# Patient Record
Sex: Male | Born: 1990 | Race: White | Hispanic: No | Marital: Single
Health system: Southern US, Community
[De-identification: ages and names within clinical notes are randomized; demographics above are authoritative.]

---
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
## 2008-12-29 IMAGING — US US SCROTUM
1 series · 14 of 25 positions shown · non-contrast
Comparison: None

CLINICAL DATA: RIGHT TESTICULAR MASS;

ULTRASOUND OF SCROTUM
TECHNIQUE: Complete ultrasound examination of the testicles,
epididymis, and other scrotal structures was performed.

[Series 1: us scrotum · 0.08mm/px · 14 of 50 slices shown]
[im 1/50]
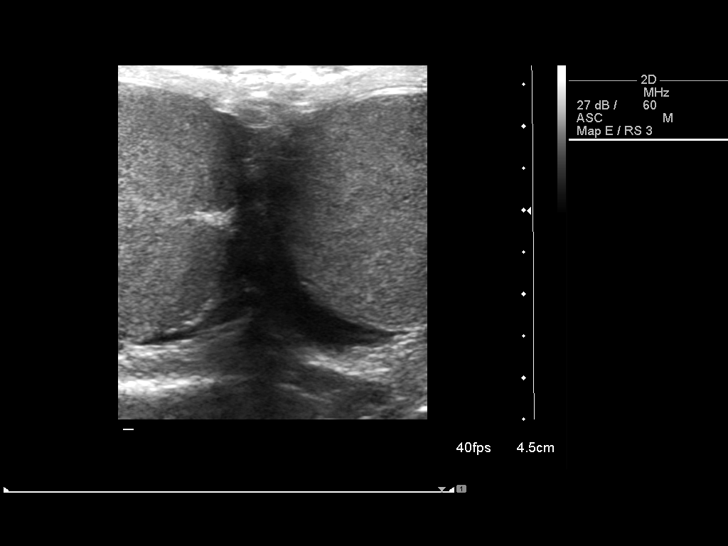
[im 5/50]
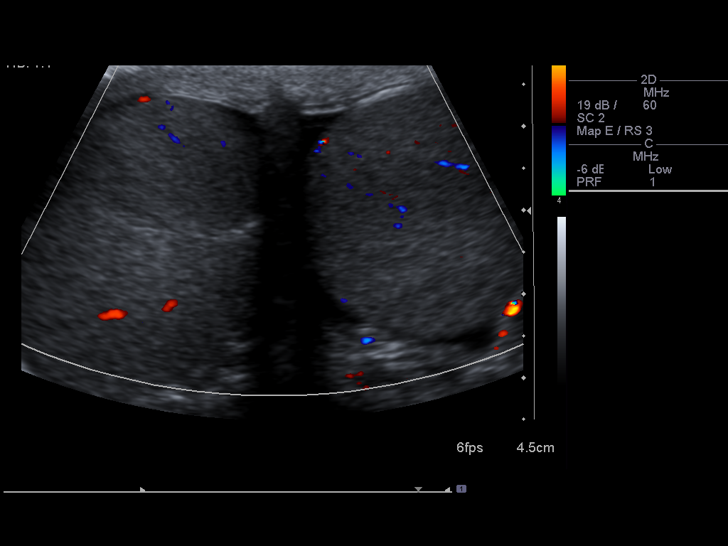
[im 9/50]
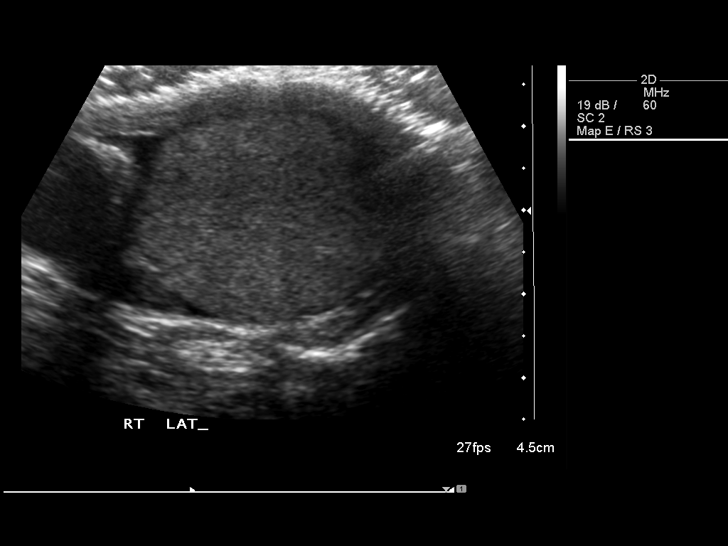
[im 13/50]
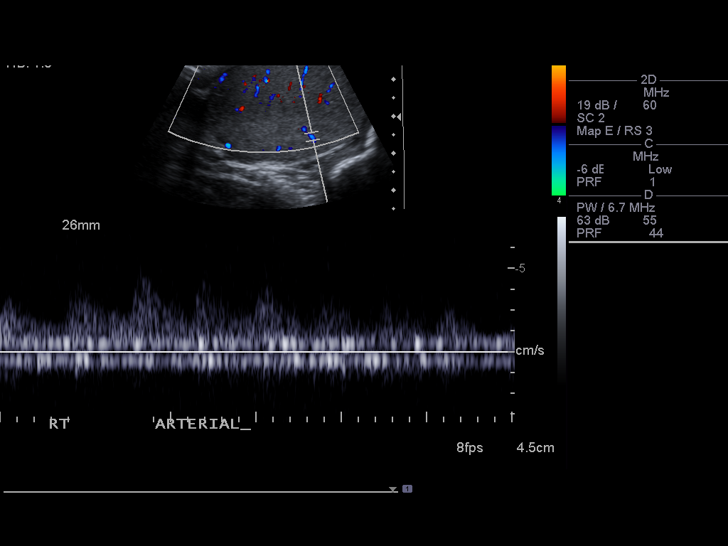
[im 17/50]
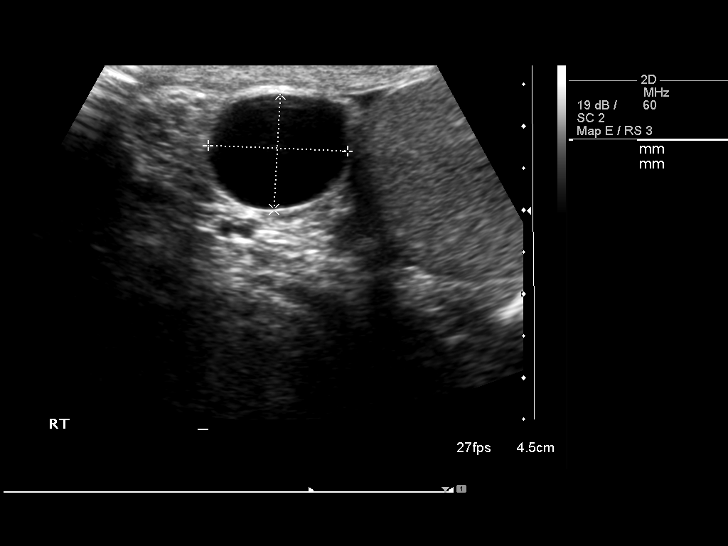
[im 19/50]
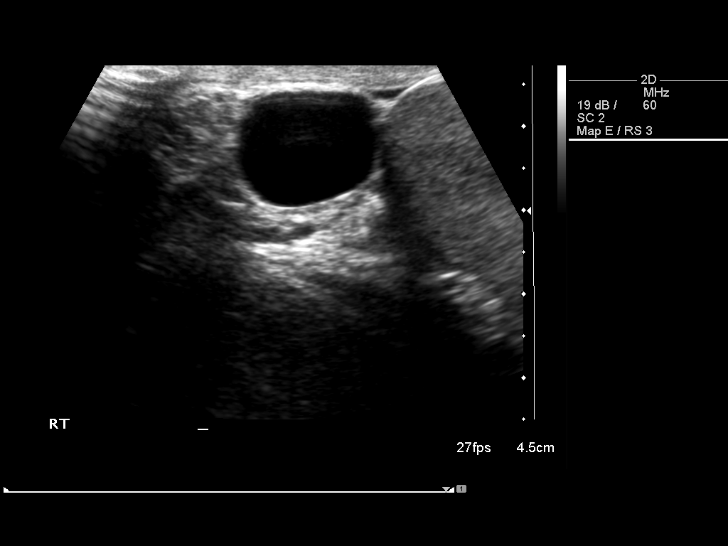
[im 23/50]
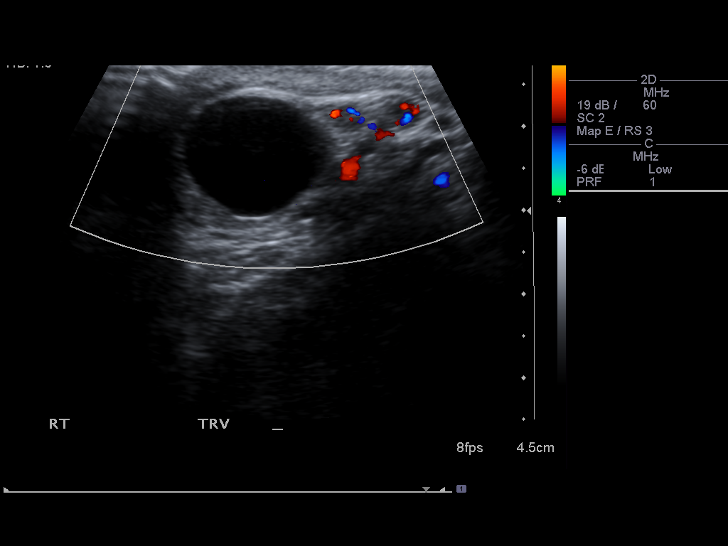
[im 27/50]
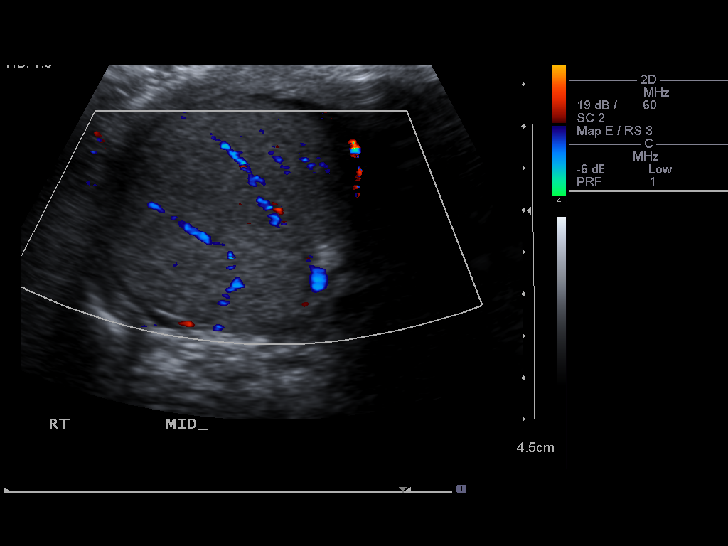
[im 31/50]
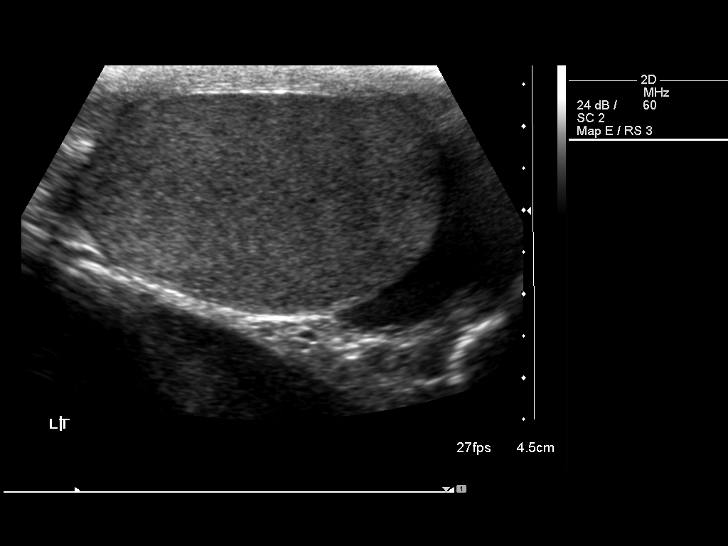
[im 33/50]
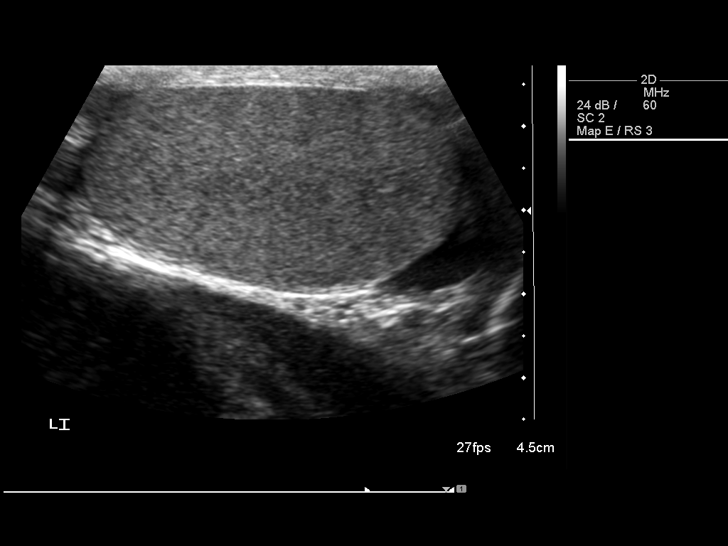
[im 37/50]
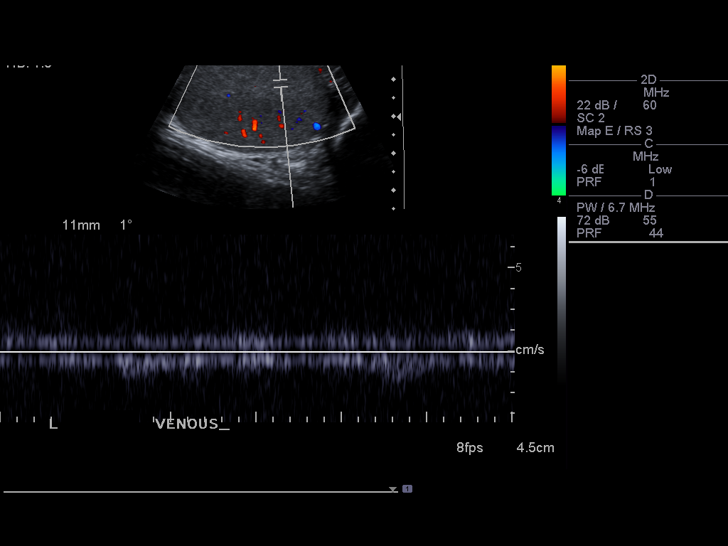
[im 41/50]
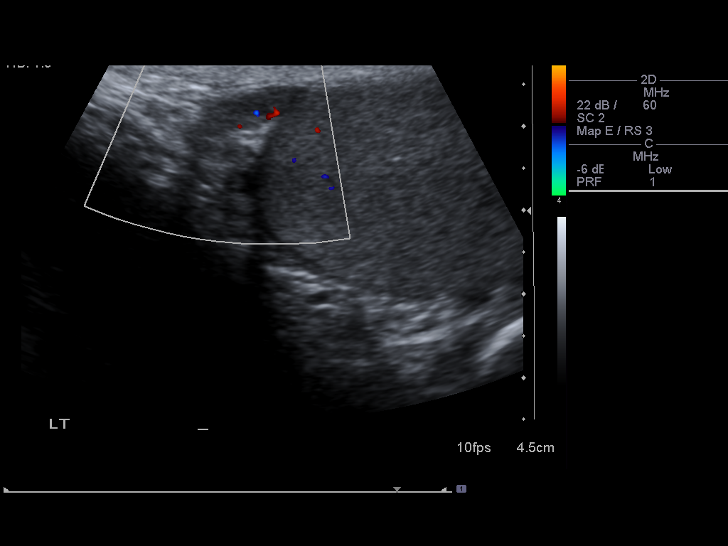
[im 45/50]
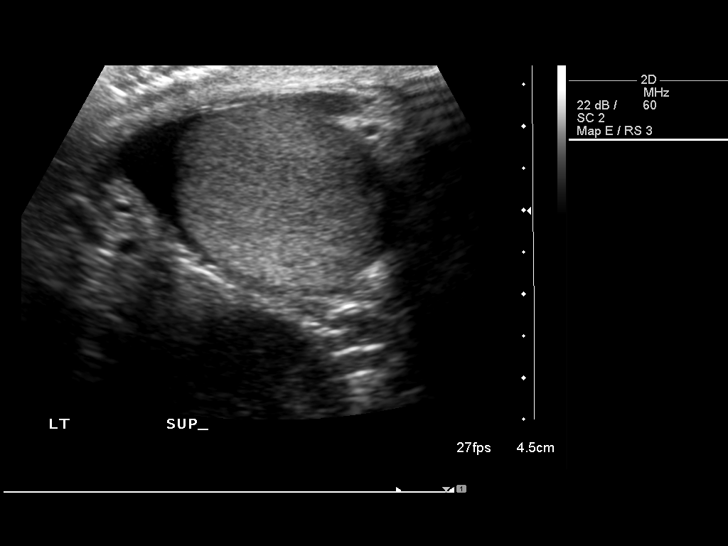
[im 50/50]
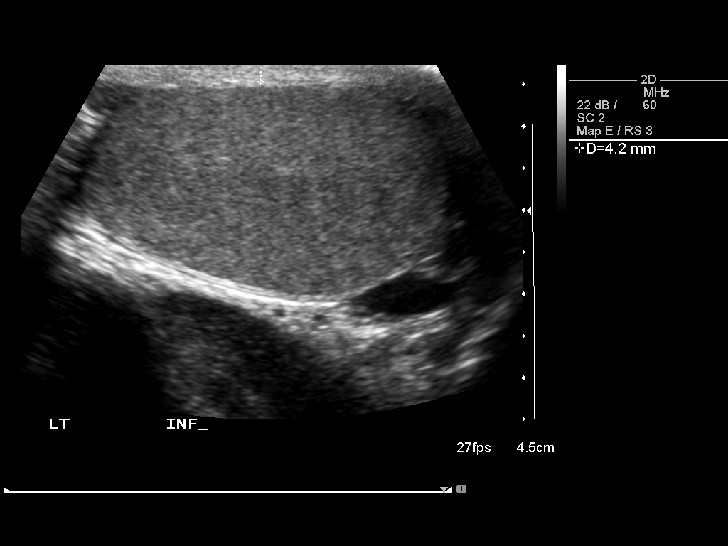

[14 of 25 positions shown; findings below may reference images not displayed]

FINDINGS: The right testicle measures 4.2 x 2.8 x 3.5 cm.  The left
testicle measures 4.4 x 2.6 x 3.1 cm.  Normal testicular
echotexture / echogenicity.  Normal color Doppler flow.  Arterial
and venous waveforms are noted.  There is a cystic lesion in the
right epididymis measuring 1.7 x 1.4 x 1.6 cm.  This is compatible
with an epididymal cyst or spermatocele.  Left epididymis
unremarkable.  Small amount of peritesticular fluid bilaterally.
IMPRESSION: Right epididymal cyst versus spermatocele.  See comments above.

## 2010-05-30 NOTE — Op Note (Signed)
NAMEBEACHER, EVERY               ACCOUNT NO.:  [id number]   MEDICAL RECORD NO.:  [id number]          PATIENT TYPE:  AMB   LOCATION:  DAY                           FACILITY:  [facility]   PHYSICIAN:  Barbaraann Barthel, M.D. DATE OF BIRTH:  [birth year]   DATE OF PROCEDURE:  03/05/2007  DATE OF DISCHARGE:                               OPERATIVE REPORT   DIAGNOSIS:  Left inguinal hernia.   PROCEDURE:  Left inguinal herniorrhaphy (modified McVay repair, no mesh  placed).   SPECIMEN:  Left inguinal hernia sac.   NOTE:  This is an 20 year old white male who presented with a mass in  his left groin that had been present for at least 2 years.  He does  considerable heavy lifting.  This was consistent with a small,  nonincarcerated left inguinal hernia.  We discussed repair with him in  detail, discussing complications not limited to but including bleeding,  infection and recurrence.  Informed consent was obtained.   GROSS OPERATIVE FINDINGS:  Those consistent with an indirect inguinal  hernia and a small direct defect as well.   TECHNIQUE:  The patient was placed in supine position.  After the  adequate administration of general anesthesia via endotracheal  intubation, his abdomen was prepped with Betadine solution and draped in  the usual manner.  An incision was carried out between the anterior-  superior iliac spine and the pubic tubercle through skin, subcutaneous  and Scarpa's layer down to the external oblique, which was opened to the  external ring.  The cords structures were dissected carefully free from  the inguinal hernia sac, which was ligated under direct vision.  We then  repaired the inguinal in the inguinal canal, suturing the fascia,  transversalis and transversus abdominis to Cooper's ligament and  Poupart's ligament with interrupted 2-0 Bralon sutures.  Prior to  cinching these tightly, we performed a relaxing incision.  We then  returned the cord structures to their  anatomic position and repaired the  external oblique over this, suturing this with 3-0 Polysorb sutures.  Subcutaneous was irrigated and the skin was approximated with a stapling  device.  Prior to closure all sponge, needle and instrument counts were  found to be correct.  Estimated blood loss was minimal.  I used  approximately 10 mL of 0.5% Sensorcaine to help with postoperative  comfort.  The patient tolerated the procedure well, was taken to the  recovery room in satisfactory condition.      Barbaraann Barthel, M.D.  Electronically Signed     WB/MEDQ  D:  03/05/2007  T:  03/06/2007  Job:  [id number]   cc:   Mila Homer. Sudie Bailey, M.D.  Fax: [phone]

## 2010-10-06 LAB — BASIC METABOLIC PANEL
Calcium: 9.8
Creatinine, Ser: 0.87
GFR calc Af Amer: >60
GFR calc non Af Amer: >60
Sodium: 139

## 2010-10-06 LAB — CBC
Hemoglobin: 15.2
RBC: 4.99
WBC: 5.5

## 2010-10-06 LAB — DIFFERENTIAL
Basophils Relative: 1
Lymphocytes Relative: 33
Lymphs Abs: 1.8
Monocytes Relative: 11
Neutro Abs: 2.9
Neutrophils Relative %: 53

## 2010-10-09 LAB — BASIC METABOLIC PANEL
CO2: 22
Chloride: 105
GFR calc non Af Amer: >60
Glucose, Bld: 125 — ABNORMAL HIGH
Potassium: 3.9
Sodium: 136

## 2020-03-21 ENCOUNTER — Other Ambulatory Visit: Payer: Self-pay

## 2020-03-21 ENCOUNTER — Emergency Department (HOSPITAL_COMMUNITY)
Admission: EM | Admit: 2020-03-21 | Discharge: 2020-03-21 | Disposition: A | Discharge status: Home / Self Care | Payer: Self-pay | Attending: Emergency Medicine | Admitting: Emergency Medicine

## 2020-03-21 DIAGNOSIS — K219 Gastro-esophageal reflux disease without esophagitis: Secondary | ICD-10-CM | POA: Insufficient documentation

## 2020-03-21 DIAGNOSIS — R197 Diarrhea, unspecified: Secondary | ICD-10-CM | POA: Insufficient documentation

## 2020-03-21 LAB — COMPREHENSIVE METABOLIC PANEL
ALT: 19 U/L (ref 0–44)
AST: 23 U/L (ref 15–41)
Albumin: 4.5 g/dL (ref 3.5–5.0)
Alkaline Phosphatase: 63 U/L (ref 38–126)
Anion gap: 11 (ref 5–15)
BUN: 8 mg/dL (ref 6–20)
CO2: 21 mmol/L — ABNORMAL LOW (ref 22–32)
Calcium: 9.8 mg/dL (ref 8.9–10.3)
Chloride: 105 mmol/L (ref 98–111)
Creatinine, Ser: 1.05 mg/dL (ref 0.61–1.24)
GFR, Estimated: >60 mL/min (ref >60)
Glucose, Bld: 115 mg/dL — ABNORMAL HIGH (ref 70–99)
Potassium: 3.8 mmol/L (ref 3.5–5.1)
Sodium: 137 mmol/L (ref 135–145)
Total Bilirubin: 0.8 mg/dL (ref 0.3–1.2)
Total Protein: 7.2 g/dL (ref 6.5–8.1)

## 2020-03-21 LAB — URINALYSIS, ROUTINE W REFLEX MICROSCOPIC
Bilirubin Urine: NEGATIVE
Glucose, UA: NEGATIVE mg/dL
Hgb urine dipstick: NEGATIVE
Ketones, ur: NEGATIVE mg/dL
Leukocytes,Ua: NEGATIVE
Nitrite: NEGATIVE
Protein, ur: NEGATIVE mg/dL
Specific Gravity, Urine: 1.002 — ABNORMAL LOW (ref 1.005–1.030)
pH: 7 (ref 5.0–8.0)

## 2020-03-21 LAB — CBC
HCT: 47.3 % (ref 39.0–52.0)
Hemoglobin: 16.2 g/dL (ref 13.0–17.0)
MCH: 30.8 pg (ref 26.0–34.0)
MCHC: 34.2 g/dL (ref 30.0–36.0)
MCV: 89.9 fL (ref 80.0–100.0)
Platelets: 366 10*3/uL (ref 150–400)
RBC: 5.26 MIL/uL (ref 4.22–5.81)
RDW: 12.2 % (ref 11.5–15.5)
WBC: 8.6 10*3/uL (ref 4.0–10.5)
nRBC: 0 % (ref 0.0–0.2)

## 2020-03-21 LAB — LIPASE, BLOOD: Lipase: 36 U/L (ref 11–51)

## 2020-03-21 MED ORDER — FAMOTIDINE IN NACL 20-0.9 MG/50ML-% IV SOLN
20.0000 mg | Freq: Once | INTRAVENOUS | Status: AC
  Administered 2020-03-21: 20 mg via INTRAVENOUS
  Filled 2020-03-21: qty 50

## 2020-03-21 MED ORDER — LIDOCAINE VISCOUS HCL 2 % MT SOLN
15.0000 mL | Freq: Once | OROMUCOSAL | Status: AC
  Administered 2020-03-21: 15 mL via ORAL
  Filled 2020-03-21: qty 15

## 2020-03-21 MED ORDER — OMEPRAZOLE 20 MG PO CPDR
20.0000 mg | DELAYED_RELEASE_CAPSULE | Freq: Every day | ORAL | 0 refills | Status: AC
Start: 2020-03-21 — End: ?

## 2020-03-21 MED ORDER — DIPHENHYDRAMINE HCL 50 MG/ML IJ SOLN: 25.0000 mg | Freq: Once | INTRAMUSCULAR | Status: AC

## 2020-03-21 MED ORDER — SODIUM CHLORIDE 0.9 % IV BOLUS
1000.0000 mL | Freq: Once | INTRAVENOUS | Status: AC
Start: 2020-03-21 — End: 2020-03-21
  Administered 2020-03-21: 1000 mL via INTRAVENOUS

## 2020-03-21 MED ORDER — ALUM & MAG HYDROXIDE-SIMETH 200-200-20 MG/5ML PO SUSP
30.0000 mL | Freq: Once | ORAL | Status: AC
  Administered 2020-03-21: 30 mL via ORAL
  Filled 2020-03-21: qty 30

## 2020-03-21 MED ORDER — DIPHENHYDRAMINE HCL 50 MG/ML IJ SOLN
INTRAMUSCULAR | Status: AC
  Administered 2020-03-21: 25 mg via INTRAVENOUS
  Filled 2020-03-21: qty 1

## 2020-03-21 MED ORDER — SODIUM CHLORIDE 0.9 % IV BOLUS
1000.0000 mL | Freq: Once | INTRAVENOUS | Status: AC
  Administered 2020-03-21: 1000 mL via INTRAVENOUS

## 2020-03-21 MED ORDER — ONDANSETRON HCL 4 MG/2ML IJ SOLN
4.0000 mg | Freq: Once | INTRAMUSCULAR | Status: AC
  Administered 2020-03-21: 4 mg via INTRAVENOUS
  Filled 2020-03-21: qty 2

## 2020-03-21 NOTE — ED Triage Notes (Signed)
Pt from home for eval of ongoing diarrhea, intermittently x 2 months, but flaring up again since last Thursday. Endorses generalized weakness.

## 2020-03-21 NOTE — ED Notes (Signed)
Provided pt with turkey bag and ice water.  

## 2020-03-21 NOTE — ED Provider Notes (Signed)
[facility] EMERGENCY DEPARTMENT Provider Note   CSN: [id number] Arrival date & time: 03/21/20  0946     History Chief Complaint  Patient presents with  . Diarrhea    Donald Fields is a 30 y.o. male with past medical history that presents the emergency department today for abdominal pain, diarrhea, nausea for the past 2 months.  Patient states that he has had intermittent burning sensation in his epigastric region that sometimes radiates down to his lower abdomen, nausea and primarily diarrhea that has been intermittent for the past 2 months.  Patient states that he saw urgent care for this when it originally started, has not followed up with a GI doctor.  Denies any blood in his stools.  Denies any vomiting.  Denies any fevers.  Patient states that this all started after he was at a wedding and drink a lot of alcohol on empty stomach, states that it flared his stomach up, since then he has had these intermittent problems.  States that he started treating himself with vitamin E oil, lidocaine root and has been staying away from alcohol, has been helping.   States that the pain in his abdomen is a burning sensation, no triggers, food does not help or relieve symptoms.  Patient states that he primarily came in today because he feels weak from not eating as much as he normally does.  Denies any syncope, numbness or tingling, headache. No urinary symptoms. NO chest pain, sob.   HPI     No past medical history on file.  There are no problems to display for this patient.      No family history on file.     Home Medications Prior to Admission medications   Medication Sig Start Date End Date Taking? Authorizing Provider  omeprazole (PRILOSEC) 20 MG capsule Take 1 capsule (20 mg total) by mouth daily. 03/21/20  Yes Farrel Gordon, PA-C    Allergies    Patient has no known allergies.  Review of Systems   Review of Systems  Constitutional: Negative for chills,  diaphoresis, fatigue and fever.  HENT: Negative for congestion, sore throat and trouble swallowing.   Eyes: Negative for pain and visual disturbance.  Respiratory: Negative for cough, shortness of breath and wheezing.   Cardiovascular: Negative for chest pain, palpitations and leg swelling.  Gastrointestinal: Positive for abdominal pain, diarrhea, nausea and vomiting. Negative for abdominal distention.  Genitourinary: Negative for difficulty urinating.  Musculoskeletal: Negative for back pain, neck pain and neck stiffness.  Skin: Negative for pallor.  Neurological: Negative for dizziness, speech difficulty, weakness and headaches.  Psychiatric/Behavioral: Negative for confusion.    Physical Exam Updated Vital Signs BP 123/62   Pulse 67   Temp 98 F (36.7 C)   Resp 16   SpO2 98%   Physical Exam Constitutional:      General: He is not in acute distress.    Appearance: Normal appearance. He is not ill-appearing, toxic-appearing or diaphoretic.  HENT:     Mouth/Throat:     Mouth: Mucous membranes are moist.     Pharynx: Oropharynx is clear.  Eyes:     General: No scleral icterus.    Extraocular Movements: Extraocular movements intact.     Pupils: Pupils are equal, round, and reactive to light.  Cardiovascular:     Rate and Rhythm: Normal rate and regular rhythm.     Pulses: Normal pulses.     Heart sounds: Normal heart sounds.  Pulmonary:  Effort: Pulmonary effort is normal. No respiratory distress.     Breath sounds: Normal breath sounds. No stridor. No wheezing, rhonchi or rales.  Chest:     Chest wall: No tenderness.  Abdominal:     General: Abdomen is flat. There is no distension.     Palpations: Abdomen is soft.     Tenderness: There is generalized abdominal tenderness. There is no guarding or rebound.  Musculoskeletal:        General: No swelling or tenderness. Normal range of motion.     Cervical back: Normal range of motion and neck supple. No rigidity.      Right lower leg: No edema.     Left lower leg: No edema.  Skin:    General: Skin is warm and dry.     Capillary Refill: Capillary refill takes less than 2 seconds.     Coloration: Skin is not pale.  Neurological:     General: No focal deficit present.     Mental Status: He is alert and oriented to person, place, and time.  Psychiatric:        Mood and Affect: Mood normal.        Behavior: Behavior normal.     ED Results / Procedures / Treatments   Labs (all labs ordered are listed, but only abnormal results are displayed) Labs Reviewed  COMPREHENSIVE METABOLIC PANEL - Abnormal; Notable for the following components:      Result Value   CO2 21 (*)    Glucose, Bld 115 (*)    All other components within normal limits  URINALYSIS, ROUTINE W REFLEX MICROSCOPIC - Abnormal; Notable for the following components:   Color, Urine COLORLESS (*)    Specific Gravity, Urine 1.002 (*)    All other components within normal limits  LIPASE, BLOOD  CBC    EKG None  Radiology No results found.  Procedures Procedures   Medications Ordered in ED Medications  sodium chloride 0.9 % bolus 1,000 mL (0 mLs Intravenous Stopped 03/21/20 1301)  ondansetron (ZOFRAN) injection 4 mg (4 mg Intravenous Given 03/21/20 1116)  alum & mag hydroxide-simeth (MAALOX/MYLANTA) 200-200-20 MG/5ML suspension 30 mL (30 mLs Oral Given 03/21/20 1119)    And  lidocaine (XYLOCAINE) 2 % viscous mouth solution 15 mL (15 mLs Oral Given 03/21/20 1119)  famotidine (PEPCID) IVPB 20 mg premix (0 mg Intravenous Stopped 03/21/20 1352)  diphenhydrAMINE (BENADRYL) injection 25 mg (25 mg Intravenous Given 03/21/20 1124)  sodium chloride 0.9 % bolus 1,000 mL (1,000 mLs Intravenous New Bag/Given 03/21/20 1307)    ED Course  I have reviewed the triage vital signs and the nursing notes.  Pertinent labs & imaging results that were available during my care of the patient were reviewed by me and considered in my medical decision making (see chart  for details).    MDM Rules/Calculators/A&P                         Donald Fields is a 30 y.o. male with past medical history that presents the emergency department today for abdominal pain, diarrhea, nausea for the past 2 months.  Differential to include GERD, peptic ulcer disease, colitis. Physical exam benign, no surgical abdomen.  Vital stable.  Will obtain basic labs and treat symptomatically with GI cocktail, Pepcid, IV fluids and Zofran.  Patient will most likely need GI referral for endoscopy not emergently.   CBC and CMP unremarkable, on repeat exam after medications, patient  states that he feels much better.  Abdominal pain completely resolved.  Repeat abdominal exam without any tenderness.  Normal vitals, patient is able to tolerate p.o., is eating.  Normal gait.  Patient ready to be discharged at this time, will treat for GERD and have patient follow-up with GI.  Patient agreeable for plan.  Doubt need for further emergent work up at this time. I explained the diagnosis and have given explicit precautions to return to the ER including for any other new or worsening symptoms. The patient understands and accepts the medical plan as it's been dictated and I have answered their questions. Discharge instructions concerning home care and prescriptions have been given. The patient is STABLE and is discharged to home in good condition.    Final Clinical Impression(s) / ED Diagnoses Final diagnoses:  Gastroesophageal reflux disease, unspecified whether esophagitis present  Diarrhea, unspecified type    Rx / DC Orders ED Discharge Orders         Ordered    omeprazole (PRILOSEC) 20 MG capsule  Daily        03/21/20 1335    Ambulatory referral to Gastroenterology        03/21/20 1335           Farrel Gordon, PA-C 03/21/20 1525    Lorre Nick, MD 03/23/20 1016

## 2020-03-21 NOTE — Discharge Instructions (Signed)
You are seen today for abdominal pain, think you most likely have acid reflux, you might have a peptic ulcer as we spoke about.  Nausea take the medication as prescribed, make sure to stay hydrated and follow-up with the GI doctor.  Make sure that you schedule an appointment with them as soon as you can.  If you have any new worsening concerning symptom please come back to the emergency department, please use the attachments.  Please speak to your pharmacist about any medications prescribed today in regards side effects or other interactions.

## 2020-03-21 NOTE — ED Notes (Signed)
I gave the pt zofran IV then the pt stated he felt real hot and became diaphretic. I told the pt to let me know he he started to feel SOB then he stated he was starting to feel SOB. I notified [place], [place] and obtained orders.
